# Patient Record
Sex: Male | Born: 1990 | Race: White | Hispanic: No | Marital: Single
Health system: Southern US, Community
[De-identification: ages and names within clinical notes are randomized; demographics above are authoritative.]

## PROBLEM LIST (undated history)

## (undated) DIAGNOSIS — F988 Other specified behavioral and emotional disorders with onset usually occurring in childhood and adolescence: Secondary | ICD-10-CM

## (undated) DIAGNOSIS — F845 Asperger's syndrome: Secondary | ICD-10-CM

## (undated) DIAGNOSIS — F259 Schizoaffective disorder, unspecified: Secondary | ICD-10-CM

## (undated) DIAGNOSIS — F319 Bipolar disorder, unspecified: Secondary | ICD-10-CM

## (undated) DIAGNOSIS — F250 Schizoaffective disorder, bipolar type: Secondary | ICD-10-CM

---

## 2017-04-16 ENCOUNTER — Encounter (HOSPITAL_COMMUNITY): Payer: Self-pay | Admitting: Emergency Medicine

## 2017-04-16 ENCOUNTER — Emergency Department (HOSPITAL_COMMUNITY)
Admission: EM | Admit: 2017-04-16 | Discharge: 2017-04-18 | Disposition: A | Discharge status: Home / Self Care | Payer: Self-pay | Attending: Emergency Medicine | Admitting: Emergency Medicine

## 2017-04-16 DIAGNOSIS — F250 Schizoaffective disorder, bipolar type: Secondary | ICD-10-CM | POA: Diagnosis present

## 2017-04-16 DIAGNOSIS — R45851 Suicidal ideations: Secondary | ICD-10-CM

## 2017-04-16 DIAGNOSIS — M546 Pain in thoracic spine: Secondary | ICD-10-CM | POA: Insufficient documentation

## 2017-04-16 HISTORY — DX: Asperger's syndrome: F84.5

## 2017-04-16 HISTORY — DX: Other specified behavioral and emotional disorders with onset usually occurring in childhood and adolescence: F98.8

## 2017-04-16 HISTORY — DX: Schizoaffective disorder, unspecified: F25.9

## 2017-04-16 HISTORY — DX: Bipolar disorder, unspecified: F31.9

## 2017-04-16 HISTORY — DX: Schizoaffective disorder, bipolar type: F25.0

## 2017-04-16 LAB — CBC
HEMATOCRIT: 43.5 % (ref 39.0–52.0)
HEMOGLOBIN: 16 g/dL (ref 13.0–17.0)
MCH: 30.3 pg (ref 26.0–34.0)
MCHC: 36.8 g/dL — AB (ref 30.0–36.0)
MCV: 82.4 fL (ref 78.0–100.0)
PLATELETS: 221 10*3/uL (ref 150–400)
RBC: 5.28 MIL/uL (ref 4.22–5.81)
RDW: 12.1 % (ref 11.5–15.5)
WBC: 9.2 10*3/uL (ref 4.0–10.5)

## 2017-04-16 MED ORDER — IBUPROFEN 200 MG PO TABS
600.0000 mg | ORAL_TABLET | Freq: Once | ORAL | Status: AC
  Administered 2017-04-17: 600 mg via ORAL
  Filled 2017-04-16: qty 3
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
## 2017-04-16 NOTE — ED Notes (Signed)
ED Provider at bedside. 
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
## 2017-04-16 NOTE — ED Notes (Signed)
Bed: WTR7 Expected date:  Expected time:  Means of arrival:  Comments: 

## 2017-04-17 ENCOUNTER — Emergency Department (HOSPITAL_COMMUNITY): Payer: Medicaid - Out of State

## 2017-04-17 LAB — COMPREHENSIVE METABOLIC PANEL
ALBUMIN: 4.8 g/dL (ref 3.5–5.0)
ALK PHOS: 44 U/L (ref 38–126)
ALT: 25 U/L (ref 17–63)
ANION GAP: 9 (ref 5–15)
AST: 33 U/L (ref 15–41)
BUN: 11 mg/dL (ref 6–20)
CALCIUM: 9.5 mg/dL (ref 8.9–10.3)
CO2: 28 mmol/L (ref 22–32)
Chloride: 105 mmol/L (ref 101–111)
Creatinine, Ser: 1.09 mg/dL (ref 0.61–1.24)
GFR calc Af Amer: >60 mL/min (ref >60)
GFR calc non Af Amer: >60 mL/min (ref >60)
GLUCOSE: 99 mg/dL (ref 65–99)
POTASSIUM: 3.3 mmol/L — AB (ref 3.5–5.1)
SODIUM: 142 mmol/L (ref 135–145)
Total Bilirubin: 1.3 mg/dL — ABNORMAL HIGH (ref 0.3–1.2)
Total Protein: 7.4 g/dL (ref 6.5–8.1)

## 2017-04-17 LAB — ETHANOL: Alcohol, Ethyl (B): <5 mg/dL (ref <5)

## 2017-04-17 LAB — SALICYLATE LEVEL: Salicylate Lvl: <7 mg/dL (ref 2.8–30.0)

## 2017-04-17 LAB — ACETAMINOPHEN LEVEL

## 2017-04-17 MED ORDER — ACETAMINOPHEN 325 MG PO TABS: 650.0000 mg | ORAL_TABLET | ORAL | Status: DC | PRN

## 2017-04-17 MED ORDER — ALUM & MAG HYDROXIDE-SIMETH 200-200-20 MG/5ML PO SUSP: 30.0000 mL | Freq: Four times a day (QID) | ORAL | Status: DC | PRN

## 2017-04-17 MED ORDER — BUPROPION HCL 100 MG PO TABS
100.0000 mg | ORAL_TABLET | Freq: Every day | ORAL | Status: DC
  Administered 2017-04-17: 100 mg via ORAL
  Filled 2017-04-17 (×2): qty 1

## 2017-04-17 MED ORDER — HYDROXYZINE HCL 25 MG PO TABS: 25.0000 mg | ORAL_TABLET | Freq: Four times a day (QID) | ORAL | Status: DC | PRN

## 2017-04-17 MED ORDER — IBUPROFEN 200 MG PO TABS: 600.0000 mg | ORAL_TABLET | Freq: Three times a day (TID) | ORAL | Status: DC | PRN

## 2017-04-17 MED ORDER — POTASSIUM CHLORIDE CRYS ER 20 MEQ PO TBCR
40.0000 meq | EXTENDED_RELEASE_TABLET | Freq: Once | ORAL | Status: AC
  Administered 2017-04-17: 40 meq via ORAL
  Filled 2017-04-17: qty 2

## 2017-04-17 NOTE — ED Notes (Signed)
Pt presents with SI, no plan, off meds for the past few days.  Feeling hopeless.  AVH, denies HI.  Pt A&O x 3, no acute distress noted, calm & cooperative at present.  Monitoring for safety, Q 15 min checks in effect.  Safety check for contraband completed, no items found.  Pt eval by TTS, Marcus at present.

## 2017-04-17 NOTE — ED Notes (Signed)
Pt is calm and cooperative.  Denies S/I and H/I.  States he still hears voices and sees shadows.  15 minute checks and video monitoring in place.

## 2017-04-17 NOTE — ED Notes (Signed)
Pt A&O x 3, no distress noted, calm & cooperative. Amb about the unit.  Interactive with staff.  Monitoring for safety, Q 15 min checks in effect.
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
## 2017-04-17 NOTE — Progress Notes (Signed)
04/17/17 1350:  LRT went to pt room to offer activities, pt was sleep.  Caroll RancherMarjette Aryella Besecker, LRT/CTRS

## 2017-04-17 NOTE — ED Notes (Addendum)
Charted on wrong pt.

## 2017-04-18 DIAGNOSIS — F250 Schizoaffective disorder, bipolar type: Secondary | ICD-10-CM

## 2017-04-18 MED ORDER — BUPROPION HCL 100 MG PO TABS: 100.0000 mg | ORAL_TABLET | Freq: Every day | ORAL | 0 refills | Status: AC

## 2017-04-18 MED ORDER — HYDROXYZINE HCL 25 MG PO TABS: 25.0000 mg | ORAL_TABLET | Freq: Four times a day (QID) | ORAL | 0 refills | Status: AC | PRN

## 2017-04-18 NOTE — BH Assessment (Signed)
[facility] Assessment Progress Note  Per Thedore MinsMojeed Akintayo, MD, this pt does not require psychiatric hospitalization at this time.  [facility], and for supportive services for the homeless.  These have been included in pt's discharge instructions.  Pt's nurse, Kendal Hymendie, has been notified.  Doylene Canninghomas Ian Cavey, MA Triage Specialist [phone]
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
## 2017-04-18 NOTE — Progress Notes (Signed)
CS[facility]. Patient requested a bus pass for transportation.  Stacy GardnerErin Joell Usman, LCSWA Clinical Social Worker [phone]

## 2017-04-18 NOTE — Discharge Instructions (Signed)
For your ongoing mental health needs, you are advised to follow up with [facility].  New and returning patients are seen at their walk-in clinic.  Walk-in hours are Monday - Friday from 8:00 am - 3:00 pm.  Walk-in patients are seen on a first come, first served basis.  Try to arrive as early as possible for he best chance of being seen the same day:       [facility]      [address]      [place], [place] [address]      [phone]  For your shelter needs, contact the following service providers:       [facility]      [address]      [place], [place] [address]      [phone]       [facility]      [address]      [place], [place] [address]      [phone]  For day shelter and other supportive services for the homeless, contact the [facility]):       [facility]      [address]      [place], [place] [address]      [phone]  For transitional housing, contact one of the following agencies.  They provide longer term housing than a shelter, but there is an application process:       [facility]      [facility]      [address]      [place], [place] [address]      [phone]

## 2017-04-18 NOTE — ED Notes (Signed)
Pt discharged ambulatory with prescriptions, resources, and discharge instructions.  Pt was in no distress.  All belongings were returned.  All discharge instructions were reviewed.

## 2017-04-18 NOTE — [facility] Suicide Risk Assessment (Signed)
Suicide Risk Assessment  Discharge Assessment   [facility]   Principal Problem: Schizoaffective disorder, bipolar type [facility]) Discharge Diagnoses:  Patient Active Problem List   Diagnosis Date Noted  . Schizoaffective disorder, bipolar type ([facility]) [F25.0] 04/18/2017    Priority: High    Total Time spent with patient: 45 minutes  Musculoskeletal: Strength & Muscle Tone: within normal limits Gait & Station: normal Patient leans: N/A  Psychiatric Specialty Exam: Physical Exam  Constitutional: He is oriented to person, place, and time. He appears well-developed and well-nourished.  HENT:  Head: Normocephalic.  Neck: Normal range of motion.  Respiratory: Effort normal.  Musculoskeletal: Normal range of motion.  Neurological: He is alert and oriented to person, place, and time.  Psychiatric: He has a normal mood and affect. His speech is normal and behavior is normal. Judgment and thought content normal. Cognition and memory are normal.    Review of Systems  All other systems reviewed and are negative.   Blood pressure 116/71, pulse 79, temperature 97.6 F (36.4 C), temperature source Oral, resp. rate 17, height 5\' 7"  (1.702 m), SpO2 100 %.There is no height or weight on file to calculate BMI.  General Appearance: Casual  Eye Contact:  Good  Speech:  Normal Rate  Volume:  Normal  Mood:  Euthymic  Affect:  Congruent  Thought Process:  Coherent and Descriptions of Associations: Intact  Orientation:  Full (Time, Place, and Person)  Thought Content:  WDL and Logical  Suicidal Thoughts:  No  Homicidal Thoughts:  No  Memory:  Immediate;   Good Recent;   Good Remote;   Good  Judgement:  Fair  Insight:  Fair  Psychomotor Activity:  Normal  Concentration:  Concentration: Good and Attention Span: Good  Recall:  Good  Fund of Knowledge:  Fair  Language:  Good  Akathisia:  No  Handed:  Right  AIMS (if indicated):     Assets:  Leisure Time Physical  Health Resilience  ADL's:  Intact  Cognition:  WNL  Sleep:       Mental Status Per Nursing Assessment::   On Admission:   suicidal ideations and auditory hallucinations  Demographic Factors:  Male and Caucasian  Loss Factors: NA  Historical Factors: NA  Risk Reduction Factors:   Sense of responsibility to family  Continued Clinical Symptoms:  None  Cognitive Features That Contribute To Risk:  None    Suicide Risk:  Minimal: No identifiable suicidal ideation.  Patients presenting with no risk factors but with morbid ruminations; may be classified as minimal risk based on the severity of the depressive symptoms    Plan Of Care/Follow-up recommendations:  Activity:  as tolerated Diet:  heart healthy diet  Carrisa Keller, NP 04/18/2017, 12:45 PM

## 2018-05-21 IMAGING — CR DG THORACIC SPINE 2V
4 series · 4 of 4 positions shown · non-contrast
Comparison: None.

CLINICAL DATA: Upper back pain for several months.

EXAM:
THORACIC SPINE 2 VIEWS

[t thoracic spine ap]
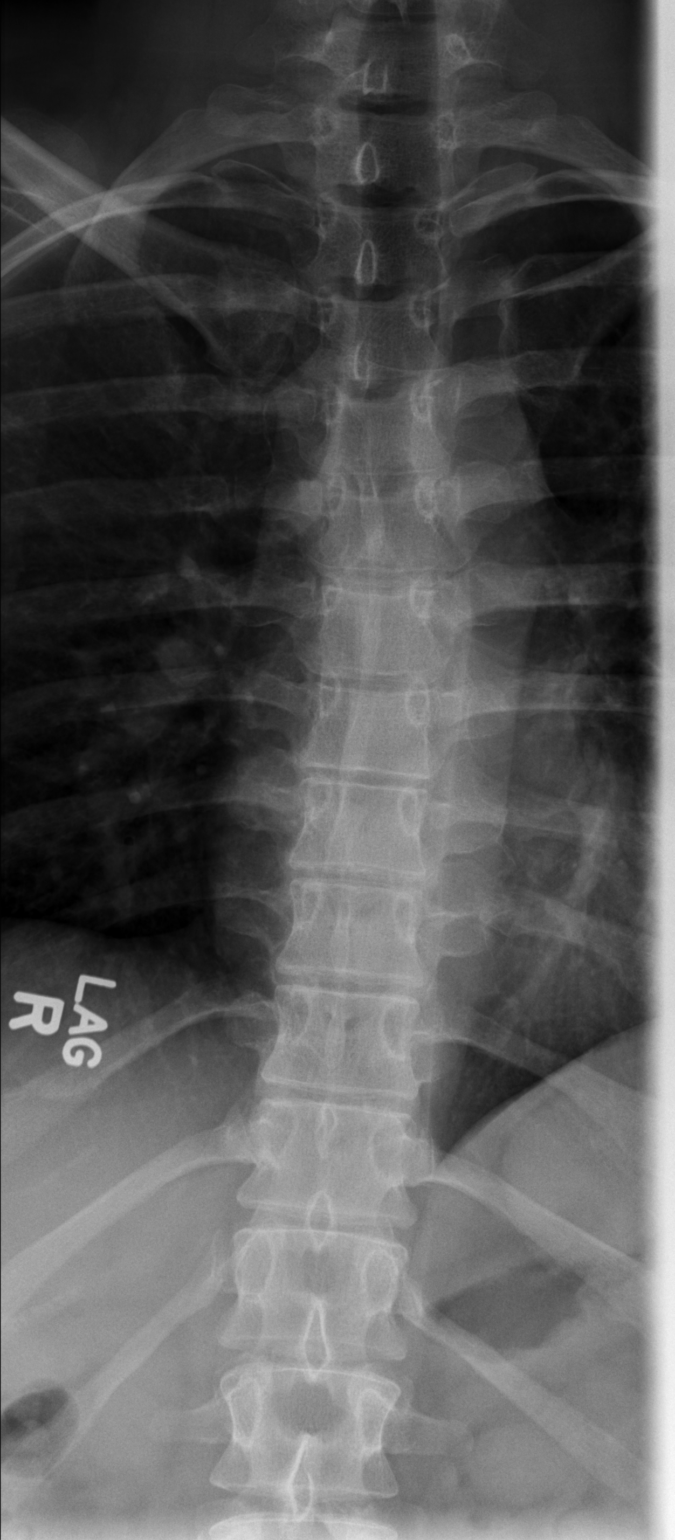

[t thoracic spine lat]
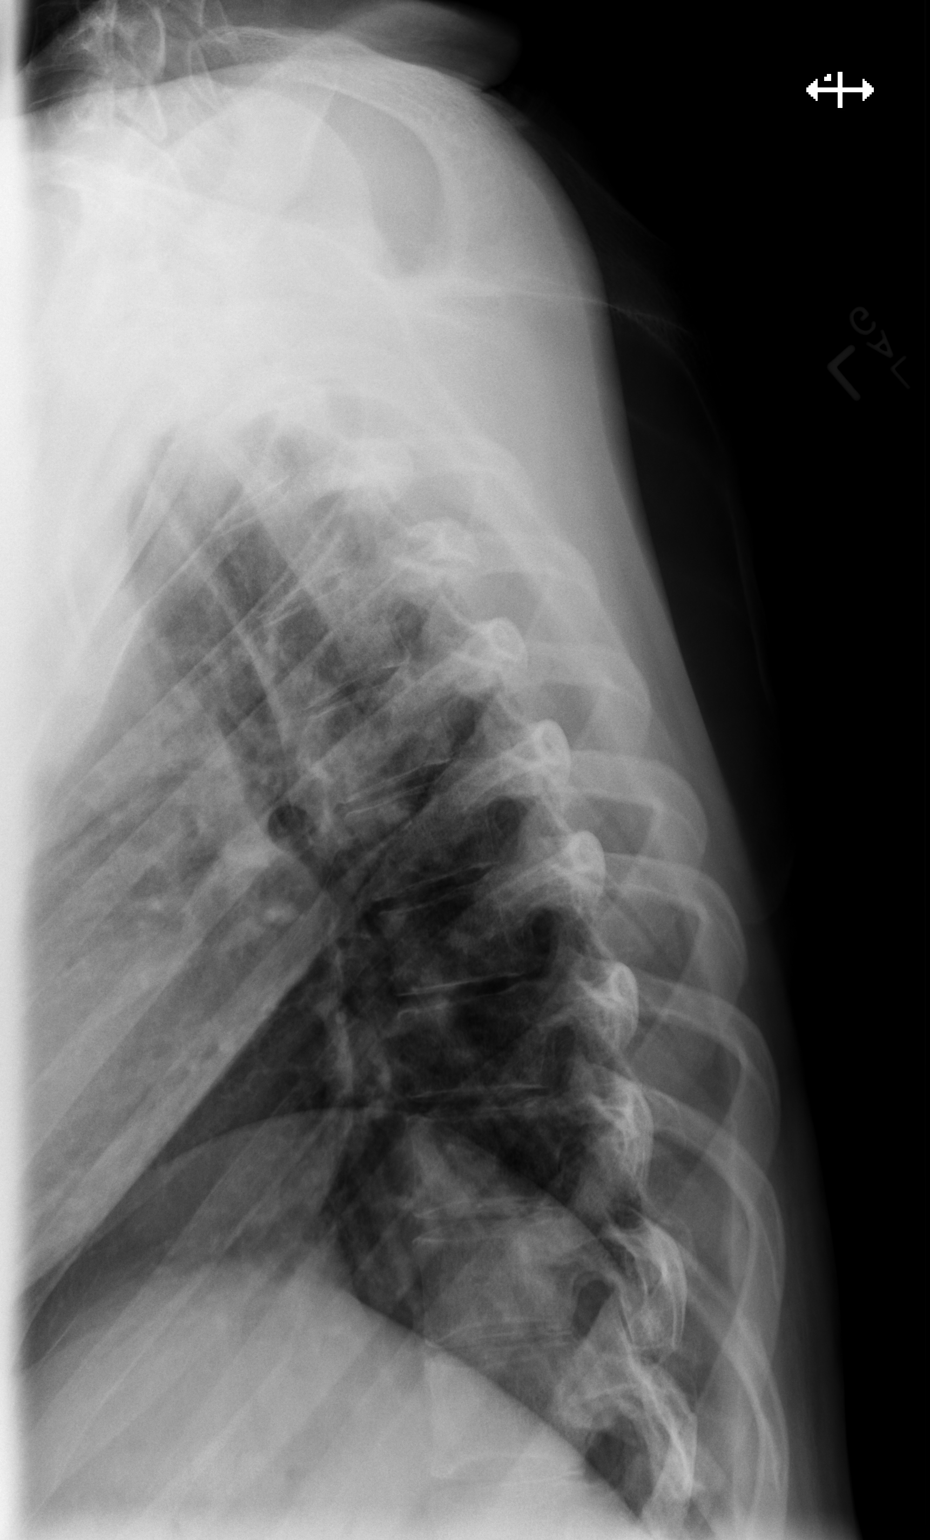

[t thoracic breathing lat]
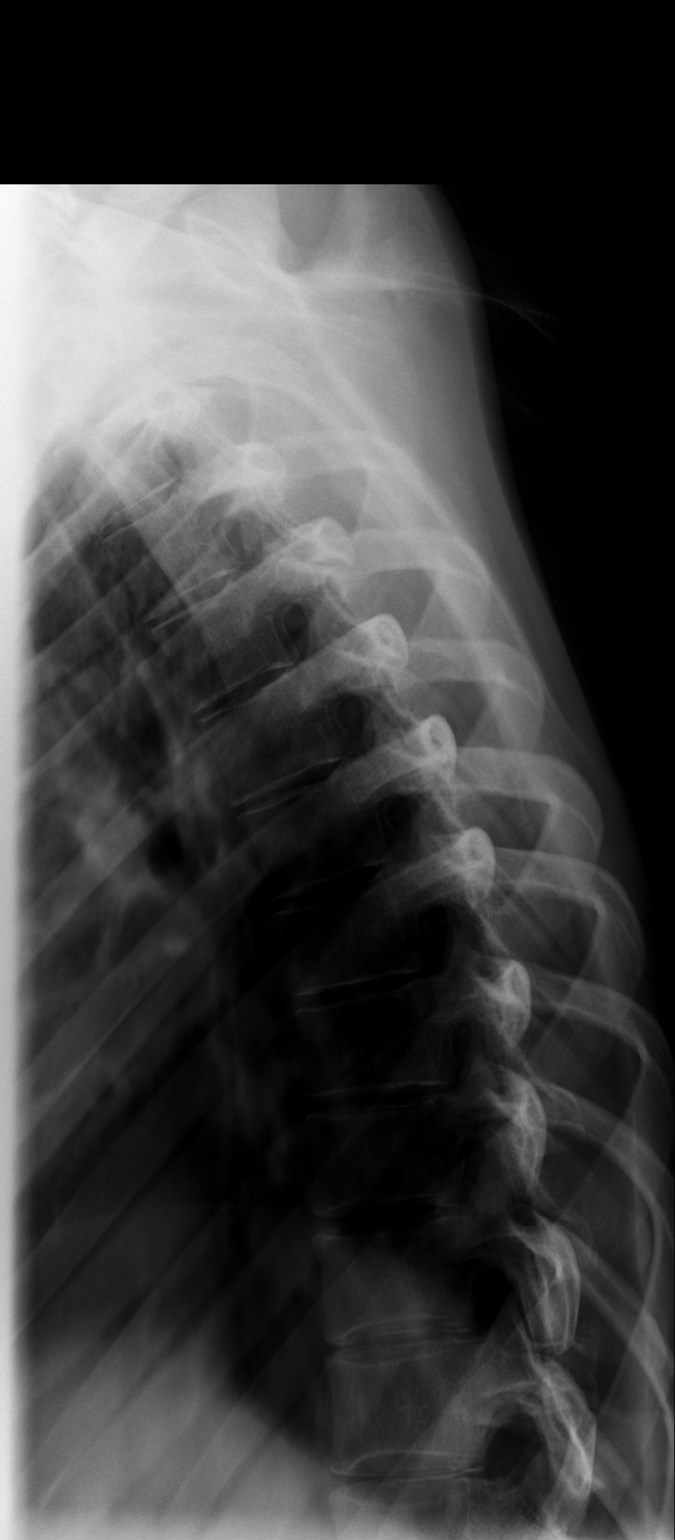

[t thoracic swimmers]
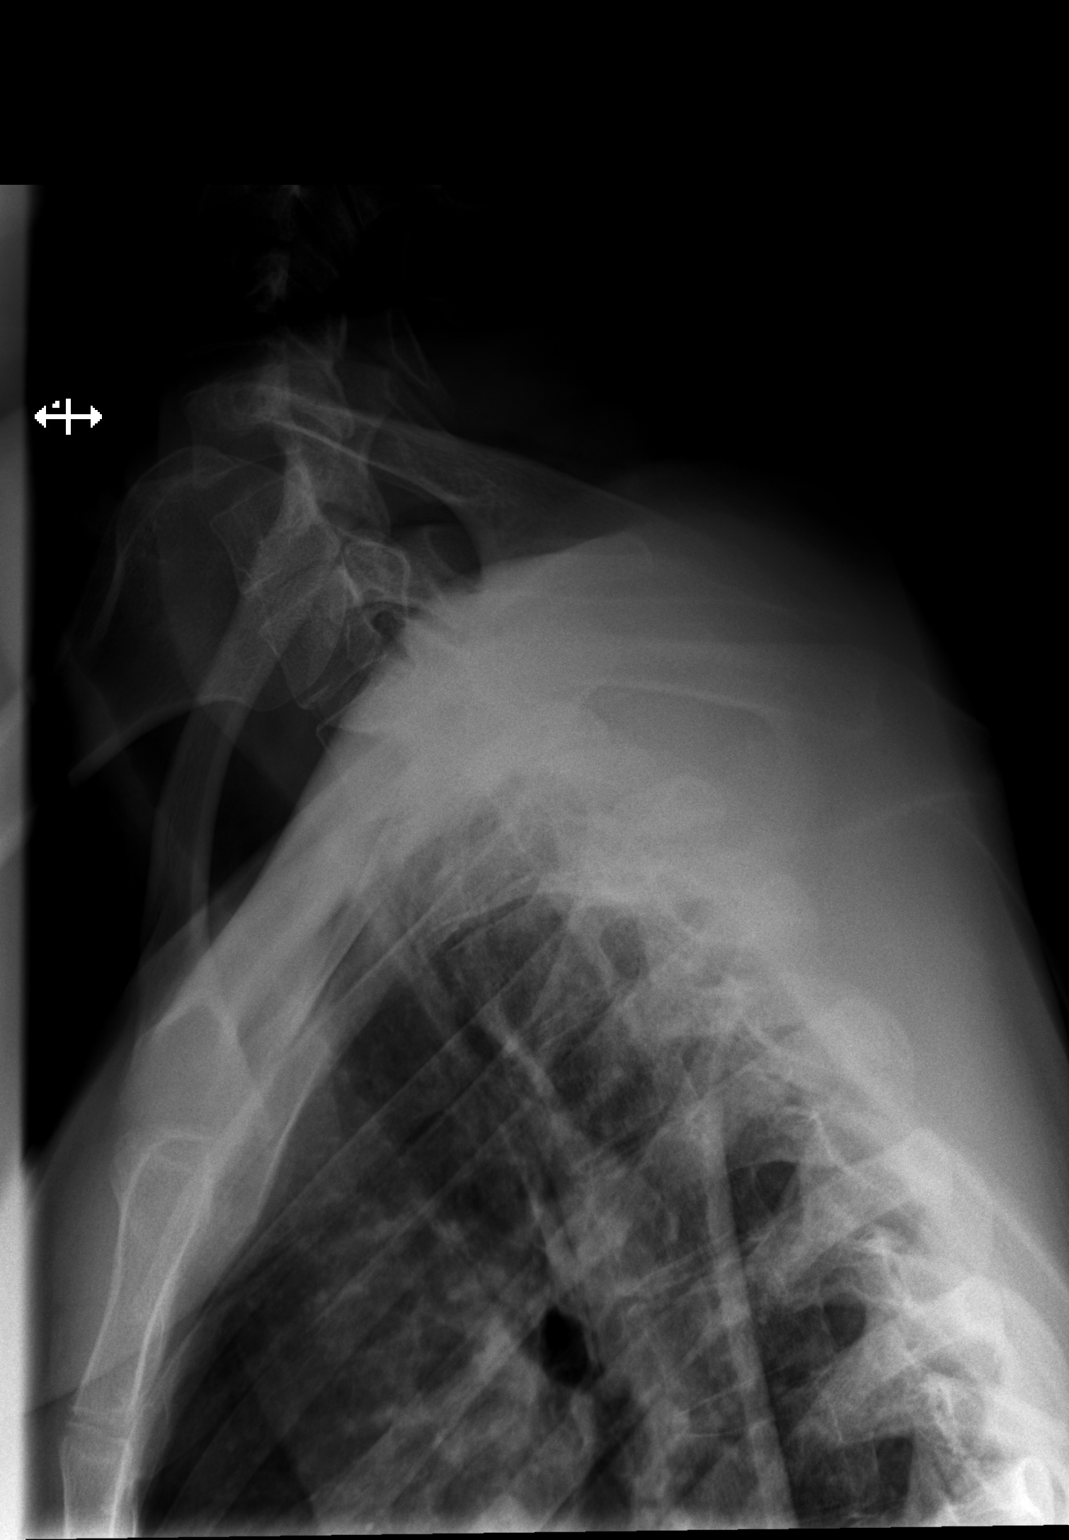

[4 of 4 positions shown; findings below may reference images not displayed]

FINDINGS: There is no evidence of thoracic spine fracture. Alignment is
normal. No other significant bone abnormalities are identified.
IMPRESSION: No significant disc space narrowing nor acute osseous abnormality of
the thoracic spine is identified. No primary bone lesions or
significant scoliosis.
